# Patient Record
Sex: Male | Born: 1950 | Race: Black or African American | Hispanic: No | Marital: Married | Smoking: Never smoker
Health system: Southern US, Community
[De-identification: ages and names within clinical notes are randomized; demographics above are authoritative.]

## PROBLEM LIST (undated history)

## (undated) DIAGNOSIS — J984 Other disorders of lung: Secondary | ICD-10-CM

## (undated) DIAGNOSIS — J45909 Unspecified asthma, uncomplicated: Secondary | ICD-10-CM

## (undated) DIAGNOSIS — J309 Allergic rhinitis, unspecified: Secondary | ICD-10-CM

## (undated) DIAGNOSIS — M5030 Other cervical disc degeneration, unspecified cervical region: Secondary | ICD-10-CM

## (undated) DIAGNOSIS — M1990 Unspecified osteoarthritis, unspecified site: Secondary | ICD-10-CM

## (undated) DIAGNOSIS — J189 Pneumonia, unspecified organism: Secondary | ICD-10-CM

## (undated) DIAGNOSIS — B353 Tinea pedis: Secondary | ICD-10-CM

## (undated) DIAGNOSIS — H547 Unspecified visual loss: Secondary | ICD-10-CM

## (undated) DIAGNOSIS — K219 Gastro-esophageal reflux disease without esophagitis: Secondary | ICD-10-CM

## (undated) DIAGNOSIS — I10 Essential (primary) hypertension: Secondary | ICD-10-CM

## (undated) HISTORY — DX: Essential (primary) hypertension: I10

## (undated) HISTORY — DX: Other disorders of lung: J98.4

## (undated) HISTORY — DX: Tinea pedis: B35.3

## (undated) HISTORY — DX: Other cervical disc degeneration, unspecified cervical region: M50.30

## (undated) HISTORY — DX: Unspecified osteoarthritis, unspecified site: M19.90

## (undated) HISTORY — DX: Allergic rhinitis, unspecified: J30.9

## (undated) HISTORY — DX: Unspecified visual loss: H54.7

## (undated) HISTORY — DX: Unspecified asthma, uncomplicated: J45.909

## (undated) HISTORY — DX: Gastro-esophageal reflux disease without esophagitis: K21.9

## (undated) HISTORY — DX: Pneumonia, unspecified organism: J18.9

---

## 2005-01-18 ENCOUNTER — Emergency Department (HOSPITAL_COMMUNITY): Admission: EM | Admit: 2005-01-18 | Discharge: 2005-01-18 | Payer: Self-pay | Admitting: Family Medicine

## 2006-12-21 ENCOUNTER — Emergency Department (HOSPITAL_COMMUNITY): Admission: EM | Admit: 2006-12-21 | Discharge: 2006-12-21 | Payer: Self-pay | Admitting: Family Medicine

## 2009-05-20 ENCOUNTER — Ambulatory Visit (HOSPITAL_COMMUNITY): Admission: RE | Admit: 2009-05-20 | Discharge: 2009-05-20 | Payer: Self-pay | Admitting: Internal Medicine

## 2009-12-02 ENCOUNTER — Emergency Department (HOSPITAL_COMMUNITY): Admission: EM | Admit: 2009-12-02 | Discharge: 2009-12-02 | Payer: Self-pay | Admitting: Family Medicine

## 2011-04-15 LAB — POCT URINALYSIS DIP (DEVICE)
Ketones, ur: NEGATIVE
Protein, ur: 100 — AB
Specific Gravity, Urine: 1.025
pH: 6

## 2011-04-15 LAB — URINE CULTURE: Colony Count: 75000

## 2012-03-17 HISTORY — PX: TRANSTHORACIC ECHOCARDIOGRAM: SHX275

## 2012-03-17 HISTORY — PX: OTHER SURGICAL HISTORY: SHX169

## 2012-03-17 HISTORY — PX: NM MYOCAR MULTIPLE W/SPECT: HXRAD626

## 2012-03-31 ENCOUNTER — Other Ambulatory Visit: Payer: Self-pay | Admitting: Internal Medicine

## 2012-03-31 ENCOUNTER — Ambulatory Visit
Admission: RE | Admit: 2012-03-31 | Discharge: 2012-03-31 | Disposition: A | Discharge status: Home / Self Care | Payer: Managed Care, Other (non HMO) | Source: Ambulatory Visit | Attending: Internal Medicine | Admitting: Internal Medicine

## 2012-03-31 DIAGNOSIS — M1990 Unspecified osteoarthritis, unspecified site: Secondary | ICD-10-CM

## 2012-03-31 DIAGNOSIS — R05 Cough: Secondary | ICD-10-CM

## 2012-03-31 DIAGNOSIS — R0989 Other specified symptoms and signs involving the circulatory and respiratory systems: Secondary | ICD-10-CM

## 2012-03-31 DIAGNOSIS — R059 Cough, unspecified: Secondary | ICD-10-CM

## 2012-11-07 ENCOUNTER — Encounter: Payer: Self-pay | Admitting: Hematology

## 2013-03-22 ENCOUNTER — Encounter: Payer: Self-pay | Admitting: Cardiovascular Disease

## 2013-10-31 IMAGING — CR DG HIP (WITH OR WITHOUT PELVIS) 2-3V*L*
2 series · 2 of 2 positions shown · non-contrast
Comparison: None.

CLINICAL DATA: Evaluate for degenerative joint disease

LEFT HIP - COMPLETE 2+ VIEW

[t hip ap left]
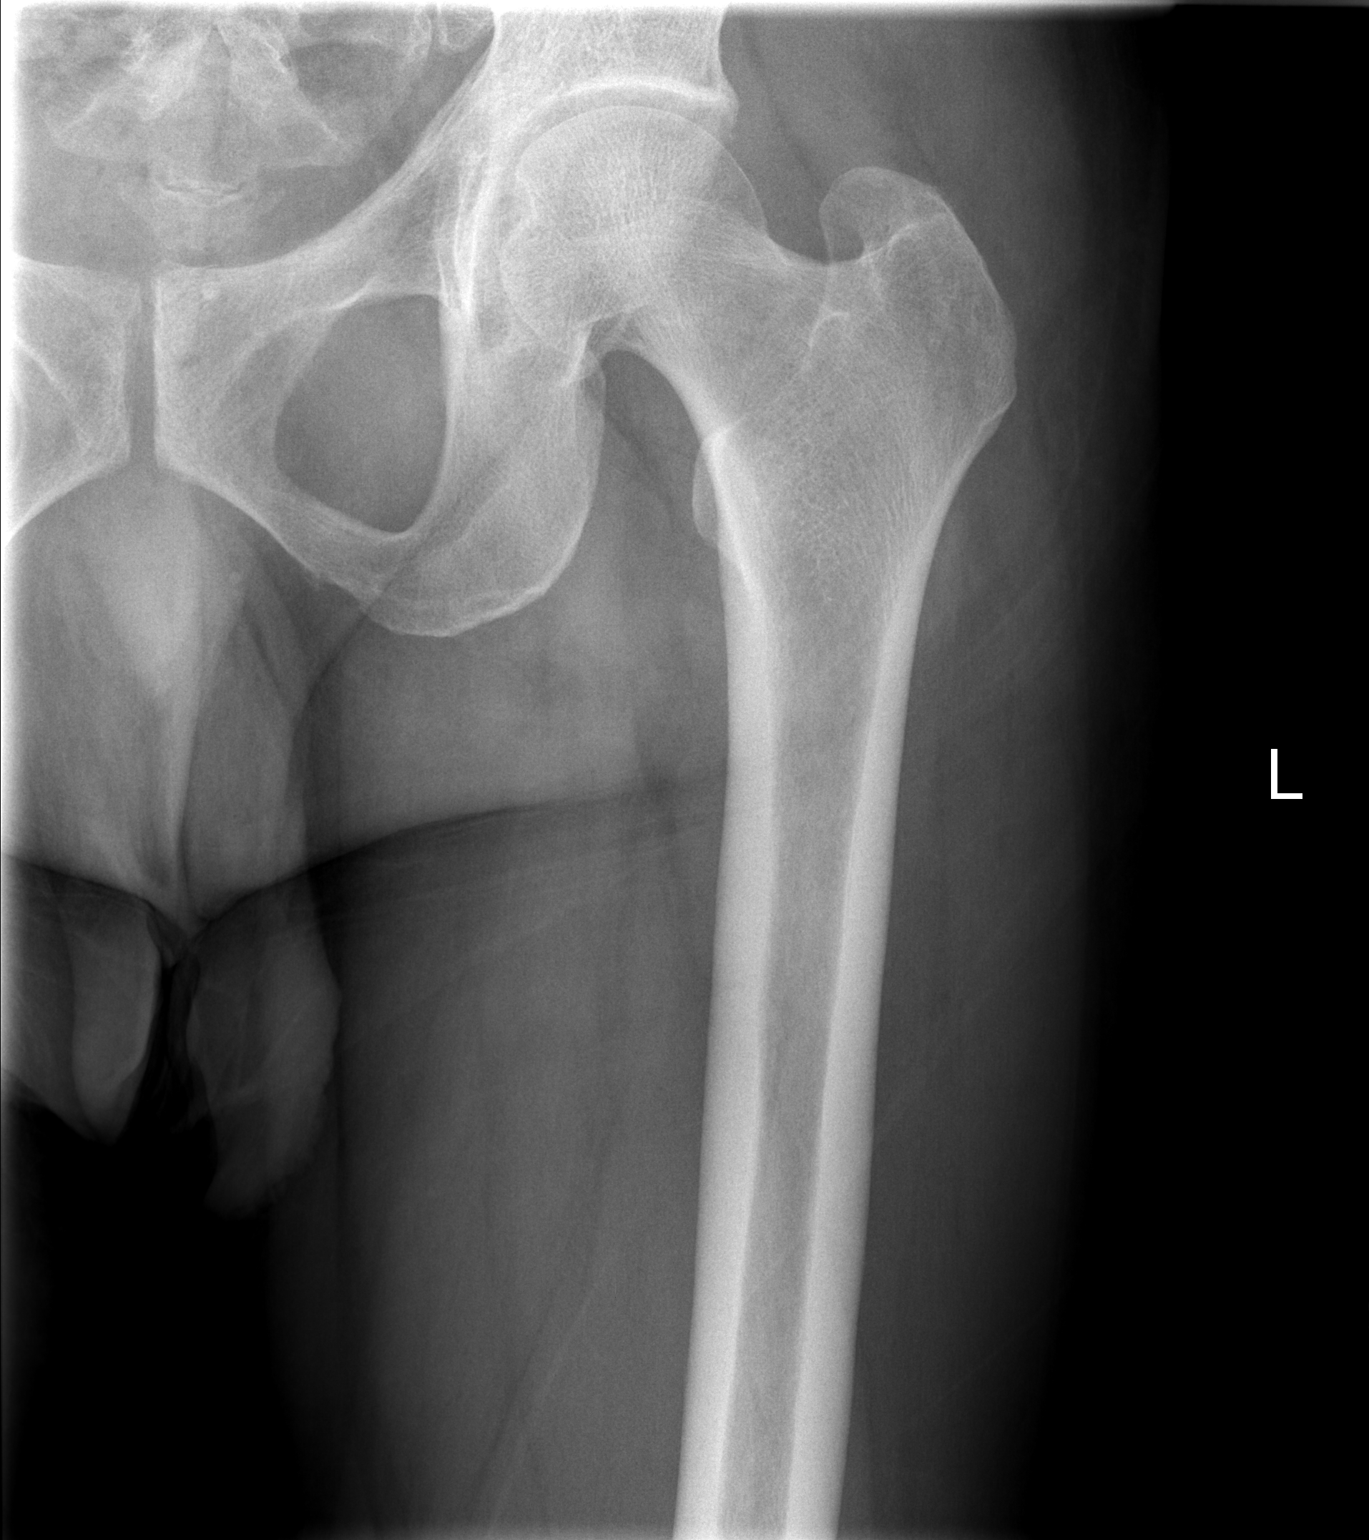

[t hip frog leg left]
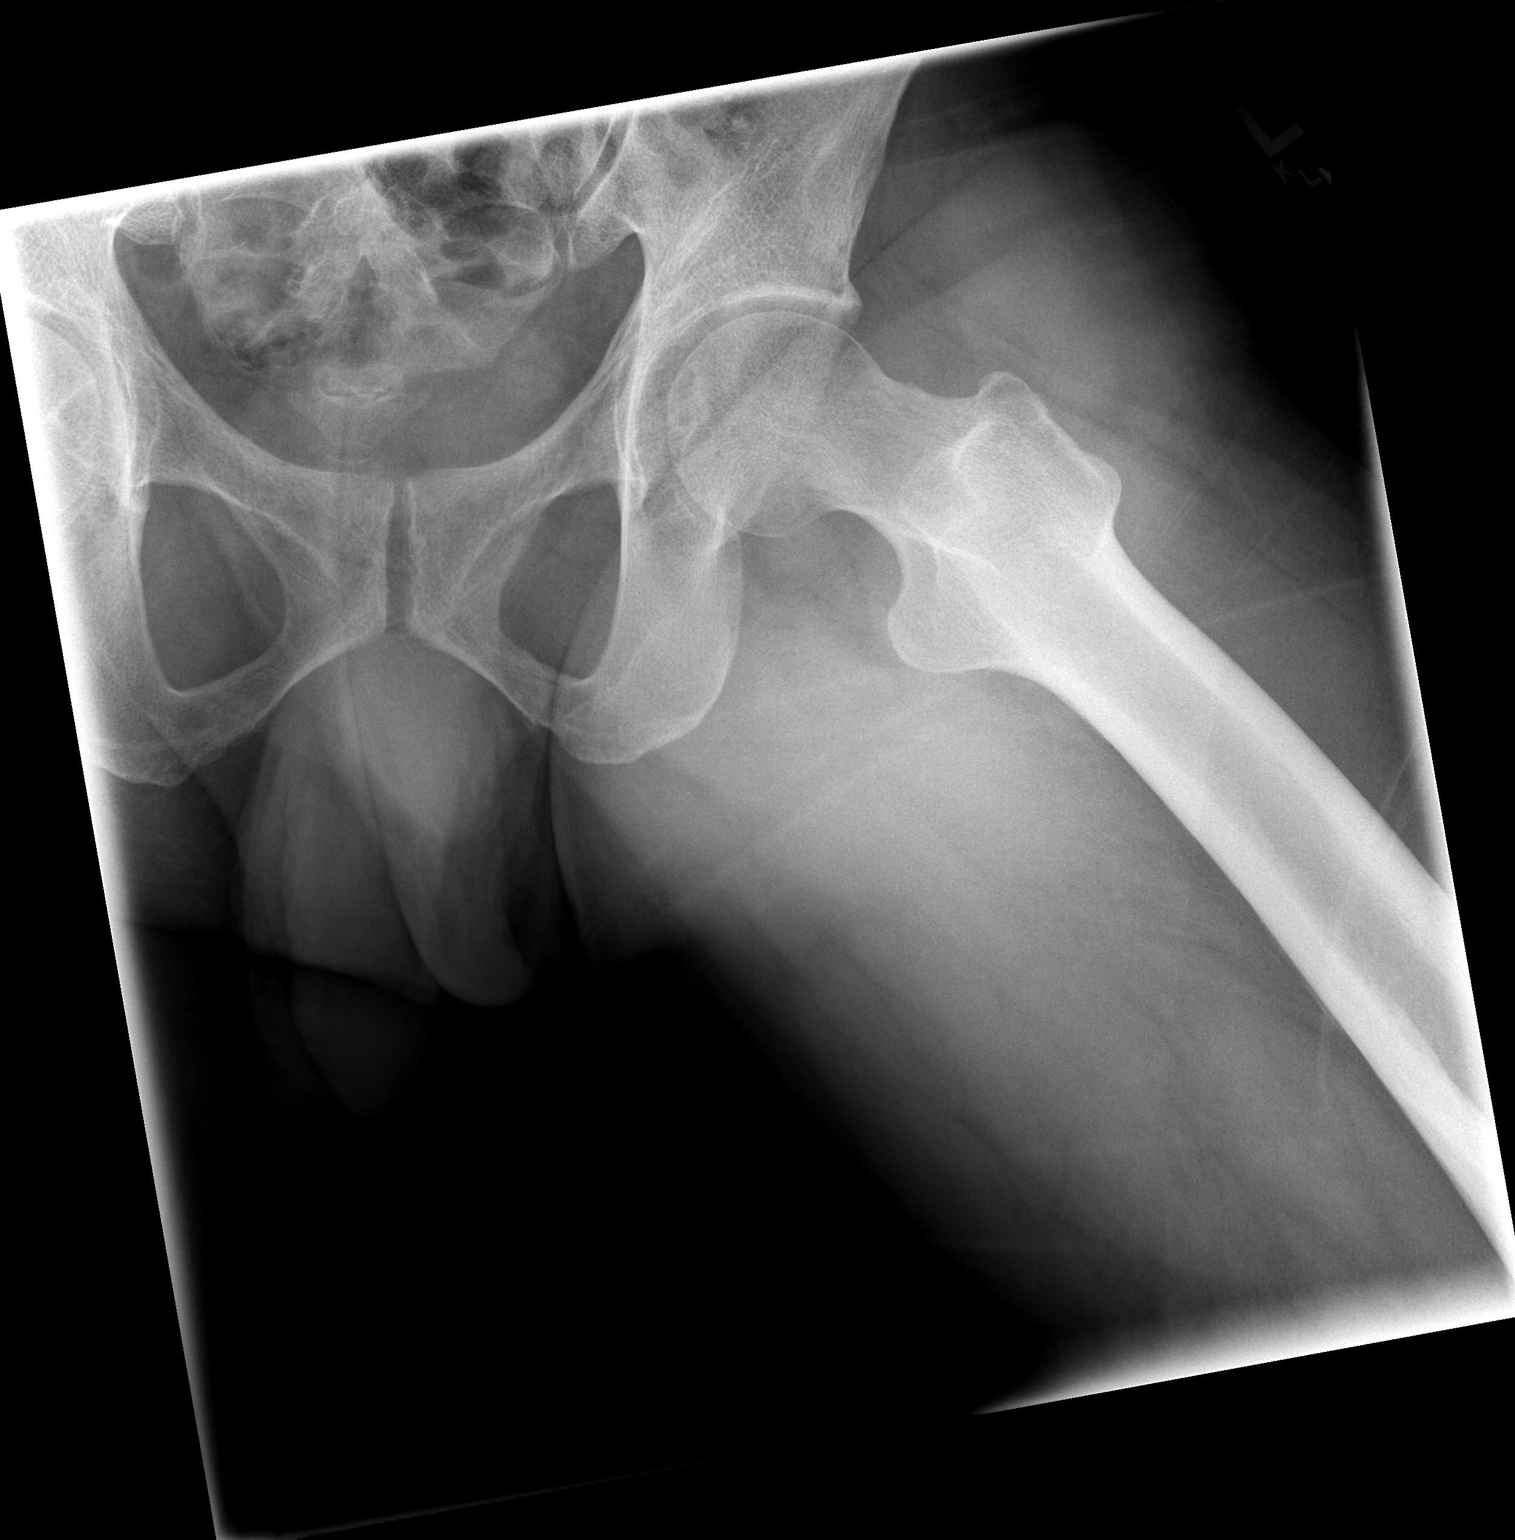

[2 of 2 positions shown; findings below may reference images not displayed]

FINDINGS: Two views of the left hip show minimal degenerative joint
disease.  No significant spurring or loss of joint space is seen.
No acute abnormality is noted.
IMPRESSION: Very minimal degenerative joint disease for age.

## 2013-10-31 IMAGING — CR DG CHEST 2V
2 series · 2 of 2 positions shown · non-contrast
Comparison: None.

CLINICAL DATA: Cough, abnormal lung sounds

CHEST - 2 VIEW

[w chest pa]
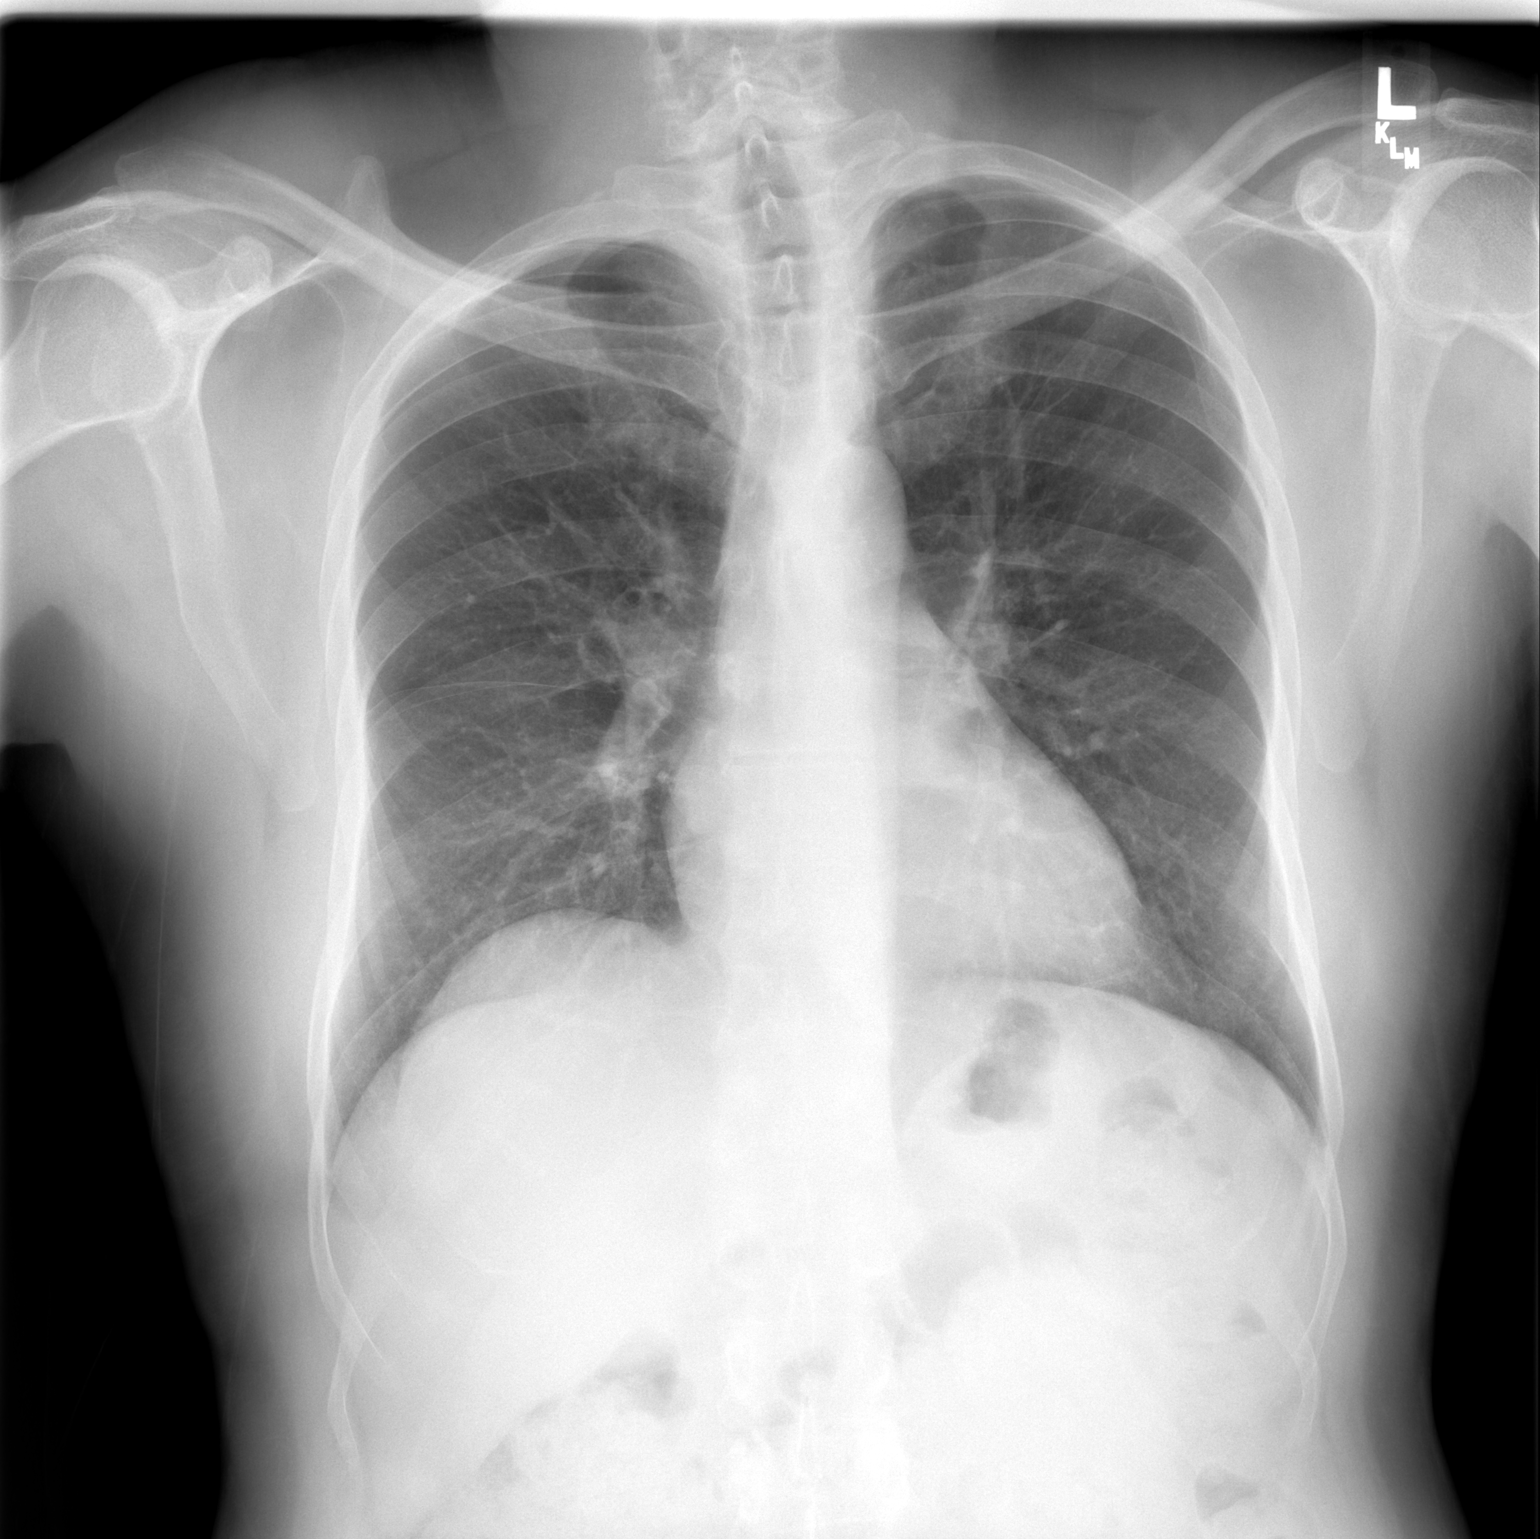

[w chest lat]
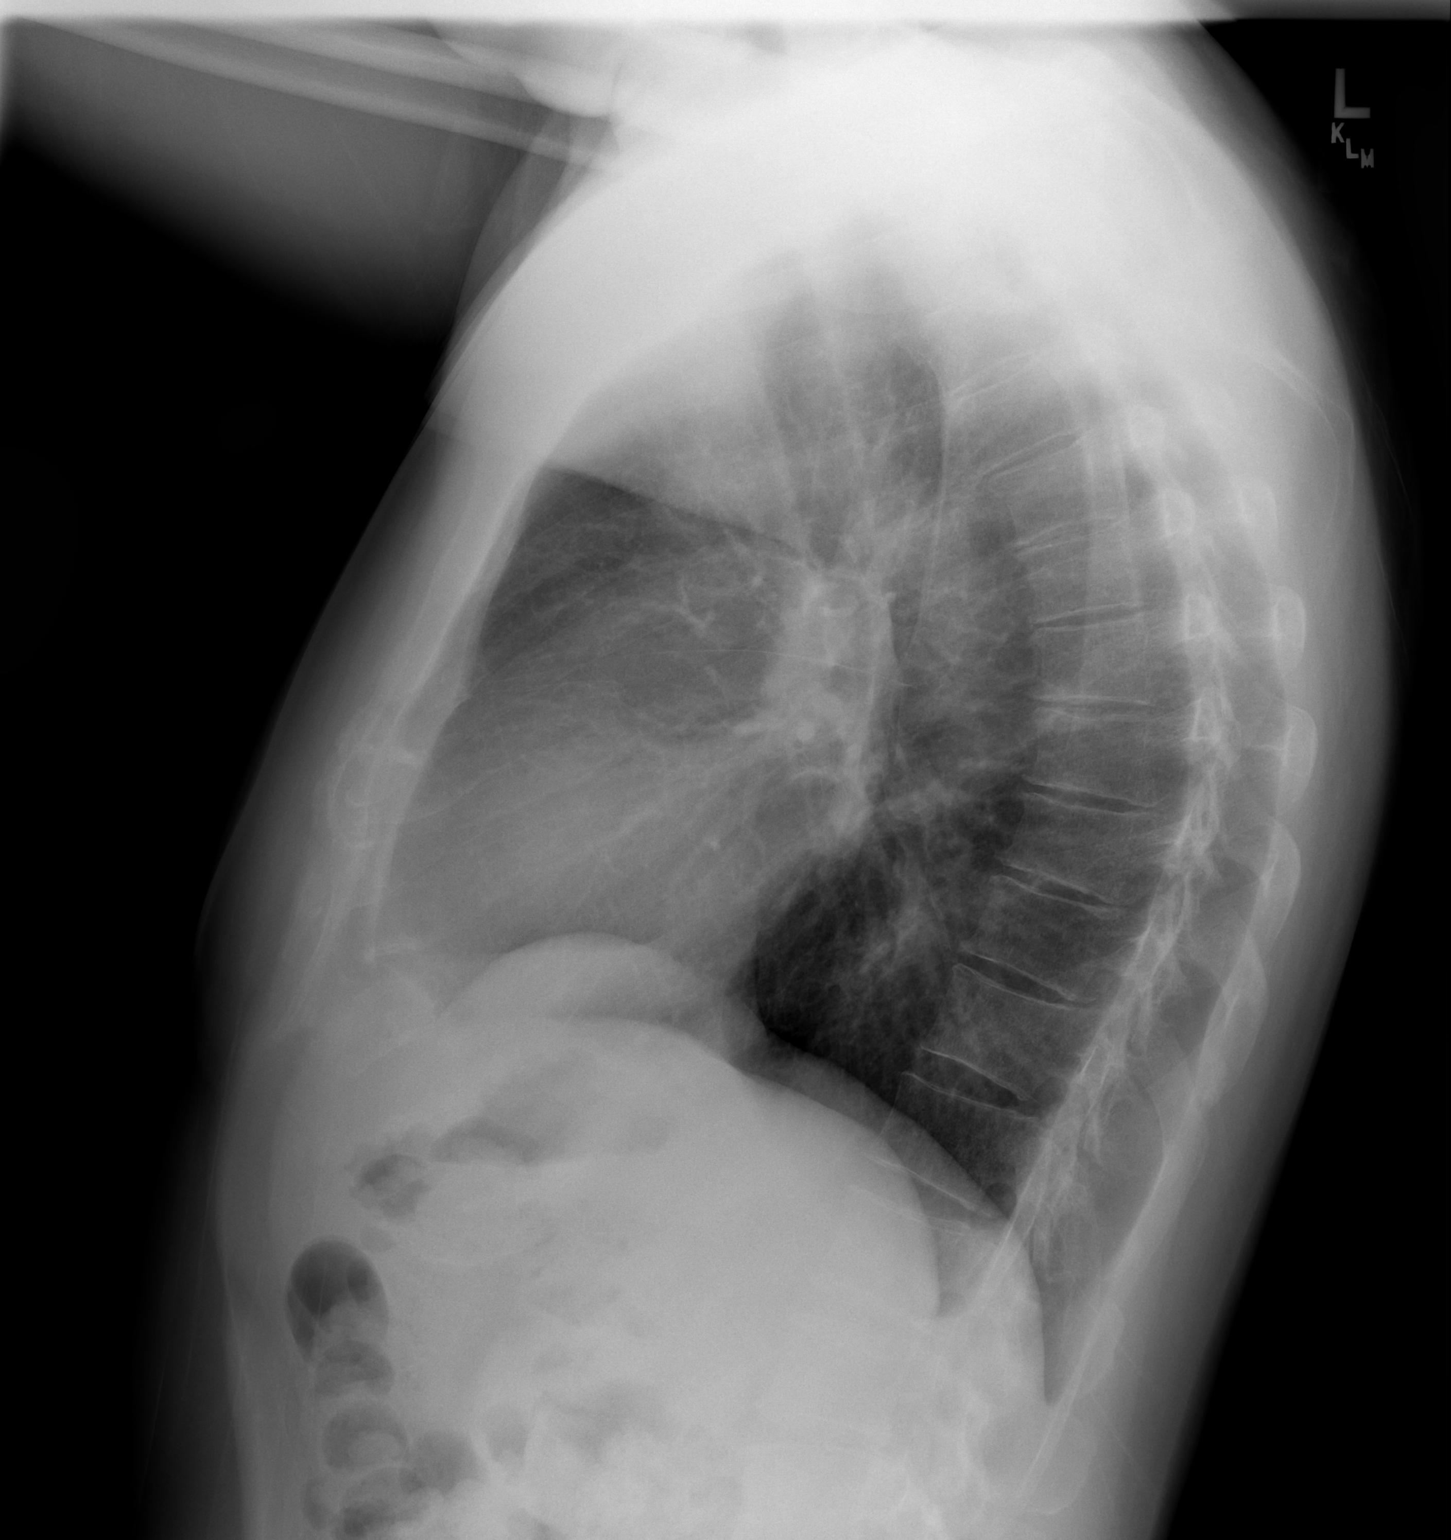

[2 of 2 positions shown; findings below may reference images not displayed]

FINDINGS: No active infiltrate or effusion is seen.  Mediastinal
contours appear normal.  The heart is within normal limits in size
for age.  No bony abnormality is seen.
IMPRESSION: No active lung disease.

## 2014-01-07 ENCOUNTER — Emergency Department (HOSPITAL_COMMUNITY)
Admission: EM | Admit: 2014-01-07 | Discharge: 2014-01-07 | Disposition: A | Discharge status: Home / Self Care | Payer: Managed Care, Other (non HMO) | Attending: Emergency Medicine | Admitting: Emergency Medicine

## 2014-01-07 ENCOUNTER — Encounter (HOSPITAL_COMMUNITY): Payer: Self-pay | Admitting: Emergency Medicine

## 2014-01-07 ENCOUNTER — Emergency Department (INDEPENDENT_AMBULATORY_CARE_PROVIDER_SITE_OTHER)
Admission: EM | Admit: 2014-01-07 | Discharge: 2014-01-07 | Disposition: A | Discharge status: Nursing Home (Includes ICF) | Payer: Managed Care, Other (non HMO) | Source: Home / Self Care | Attending: Family Medicine | Admitting: Family Medicine

## 2014-01-07 DIAGNOSIS — I498 Other specified cardiac arrhythmias: Secondary | ICD-10-CM | POA: Insufficient documentation

## 2014-01-07 DIAGNOSIS — Z8739 Personal history of other diseases of the musculoskeletal system and connective tissue: Secondary | ICD-10-CM | POA: Insufficient documentation

## 2014-01-07 DIAGNOSIS — E860 Dehydration: Secondary | ICD-10-CM

## 2014-01-07 DIAGNOSIS — Z8701 Personal history of pneumonia (recurrent): Secondary | ICD-10-CM | POA: Insufficient documentation

## 2014-01-07 DIAGNOSIS — R110 Nausea: Secondary | ICD-10-CM | POA: Insufficient documentation

## 2014-01-07 DIAGNOSIS — J45909 Unspecified asthma, uncomplicated: Secondary | ICD-10-CM | POA: Insufficient documentation

## 2014-01-07 DIAGNOSIS — Z8619 Personal history of other infectious and parasitic diseases: Secondary | ICD-10-CM | POA: Insufficient documentation

## 2014-01-07 DIAGNOSIS — I10 Essential (primary) hypertension: Secondary | ICD-10-CM | POA: Insufficient documentation

## 2014-01-07 DIAGNOSIS — Z8719 Personal history of other diseases of the digestive system: Secondary | ICD-10-CM | POA: Insufficient documentation

## 2014-01-07 DIAGNOSIS — Z79899 Other long term (current) drug therapy: Secondary | ICD-10-CM | POA: Insufficient documentation

## 2014-01-07 DIAGNOSIS — R5383 Other fatigue: Secondary | ICD-10-CM

## 2014-01-07 DIAGNOSIS — R001 Bradycardia, unspecified: Secondary | ICD-10-CM

## 2014-01-07 DIAGNOSIS — R5381 Other malaise: Secondary | ICD-10-CM | POA: Insufficient documentation

## 2014-01-07 DIAGNOSIS — Z7982 Long term (current) use of aspirin: Secondary | ICD-10-CM | POA: Insufficient documentation

## 2014-01-07 LAB — CBC WITH DIFFERENTIAL/PLATELET
Basophils Absolute: 0 10*3/uL (ref 0.0–0.1)
Basophils Relative: 1 % (ref 0–1)
Eosinophils Absolute: 0 10*3/uL (ref 0.0–0.7)
Eosinophils Relative: 1 % (ref 0–5)
HCT: 42 % (ref 39.0–52.0)
HEMOGLOBIN: 14.8 g/dL (ref 13.0–17.0)
LYMPHS ABS: 1.3 10*3/uL (ref 0.7–4.0)
LYMPHS PCT: 29 % (ref 12–46)
MCH: 31.4 pg (ref 26.0–34.0)
MCHC: 35.2 g/dL (ref 30.0–36.0)
MCV: 89.2 fL (ref 78.0–100.0)
MONO ABS: 0.6 10*3/uL (ref 0.1–1.0)
Monocytes Relative: 14 % — ABNORMAL HIGH (ref 3–12)
Neutro Abs: 2.4 10*3/uL (ref 1.7–7.7)
Neutrophils Relative %: 55 % (ref 43–77)
Platelets: 204 10*3/uL (ref 150–400)
RBC: 4.71 MIL/uL (ref 4.22–5.81)
RDW: 12.4 % (ref 11.5–15.5)
WBC: 4.4 10*3/uL (ref 4.0–10.5)

## 2014-01-07 LAB — COMPREHENSIVE METABOLIC PANEL
ALT: 21 U/L (ref 0–53)
AST: 23 U/L (ref 0–37)
Albumin: 3.6 g/dL (ref 3.5–5.2)
Alkaline Phosphatase: 69 U/L (ref 39–117)
Anion gap: 12 (ref 5–15)
BUN: 13 mg/dL (ref 6–23)
CALCIUM: 9.5 mg/dL (ref 8.4–10.5)
CO2: 29 meq/L (ref 19–32)
CREATININE: 1.13 mg/dL (ref 0.50–1.35)
Chloride: 98 mEq/L (ref 96–112)
GFR, EST AFRICAN AMERICAN: 78 mL/min — AB (ref >90)
GFR, EST NON AFRICAN AMERICAN: 67 mL/min — AB (ref >90)
GLUCOSE: 96 mg/dL (ref 70–99)
Potassium: 3.9 mEq/L (ref 3.7–5.3)
Sodium: 139 mEq/L (ref 137–147)
Total Bilirubin: 0.9 mg/dL (ref 0.3–1.2)
Total Protein: 7 g/dL (ref 6.0–8.3)

## 2014-01-07 LAB — URINALYSIS, ROUTINE W REFLEX MICROSCOPIC
Bilirubin Urine: NEGATIVE
Glucose, UA: NEGATIVE mg/dL
Hgb urine dipstick: NEGATIVE
KETONES UR: NEGATIVE mg/dL
LEUKOCYTES UA: NEGATIVE
Nitrite: NEGATIVE
PROTEIN: NEGATIVE mg/dL
Specific Gravity, Urine: 1.004 — ABNORMAL LOW (ref 1.005–1.030)
UROBILINOGEN UA: 0.2 mg/dL (ref 0.0–1.0)
pH: 6.5 (ref 5.0–8.0)

## 2014-01-07 LAB — POCT I-STAT, CHEM 8
BUN: 13 mg/dL (ref 6–23)
CALCIUM ION: 1.15 mmol/L (ref 1.13–1.30)
CHLORIDE: 96 meq/L (ref 96–112)
CREATININE: 1.2 mg/dL (ref 0.50–1.35)
GLUCOSE: 94 mg/dL (ref 70–99)
HCT: 42 % (ref 39.0–52.0)
Hemoglobin: 14.3 g/dL (ref 13.0–17.0)
Potassium: 3.6 mEq/L — ABNORMAL LOW (ref 3.7–5.3)
Sodium: 137 mEq/L (ref 137–147)
TCO2: 30 mmol/L (ref 0–100)

## 2014-01-07 LAB — CBG MONITORING, ED: GLUCOSE-CAPILLARY: 96 mg/dL (ref 70–99)

## 2014-01-07 MED ORDER — SODIUM CHLORIDE 0.9 % IV SOLN: 1000.0000 mL | INTRAVENOUS | Status: DC

## 2014-01-07 MED ORDER — SODIUM CHLORIDE 0.9 % IV SOLN: Freq: Once | INTRAVENOUS | Status: DC

## 2014-01-07 MED ORDER — SODIUM CHLORIDE 0.9 % IV SOLN
1000.0000 mL | Freq: Once | INTRAVENOUS | Status: DC
Start: 2014-01-07 — End: 2014-01-08

## 2014-01-07 MED ORDER — SODIUM CHLORIDE 0.9 % IV BOLUS (SEPSIS)
1000.0000 mL | Freq: Once | INTRAVENOUS | Status: AC
  Administered 2014-01-07: 1000 mL via INTRAVENOUS

## 2014-01-07 MED ORDER — SODIUM CHLORIDE 0.9 % IV SOLN
1000.0000 mL | Freq: Once | INTRAVENOUS | Status: AC
  Administered 2014-01-07: 1000 mL via INTRAVENOUS

## 2014-01-07 NOTE — ED Notes (Signed)
C/O hypotension.  Was feeling sluggish today, wife checked BP = 90's/60's.  Denies any pain, SOB.  Feels slight nausea.  States DID take HTN med today.  Bradycardia noted - denies hx.  Reports being in middle of 21-day fast (witholding food throughout the day) for religious purposes.

## 2014-01-07 NOTE — ED Notes (Signed)
[facility] em[facility] Pt is on month long fasting for religious beliefs. Pt was working in yard for 5 hours yesterday. Pt sent here for dehydration. Pt was bradycardic in 40's and hypotensive at home. Pt c/o dizziness and nausea. Inverted t waved noted in avf leads. Pt sts balance was off yesterday.

## 2014-01-07 NOTE — ED Notes (Signed)
[facility] truck unavailable for several minutes.  [facility] en route.  Report called to [place], ED Consulting civil engineerCharge RN.

## 2014-01-07 NOTE — ED Provider Notes (Signed)
CSN: [id number]     Arrival date & time 01/07/14  1920 History   First MD Initiated Contact with Patient 01/07/14 1926     Chief Complaint  Patient presents with  . Dehydration     (Consider location/radiation/quality/duration/timing/severity/associated sxs/prior Treatment) HPI [name] with hx of HTN, asthma, PNA, presents with weakness and nausea, [facility] Concern for dehydration. Patient also with low HR. Hypotensive at home per blood pressure check at home. Patient denies any chest pain, abdominal pain, numbness, tingling.   Past Medical History  Diagnosis Date  . GERD (gastroesophageal reflux disease)   . PNA (pneumonia)   . Hypertension   . Asthma   . Allergic rhinitis   . DJD (degenerative joint disease)   . Visual impairment   . Tinea pedis   . DDD (degenerative disc disease), cervical   . Restrictive lung disease    History reviewed. No pertinent past surgical history. Family History  Problem Relation Age of Onset  . Hypertension Mother    History  Substance Use Topics  . Smoking status: Never Smoker   . Smokeless tobacco: Not on file  . Alcohol Use: No    Review of Systems  Constitutional: Negative for fever and chills.  HENT: Negative for sore throat.   Eyes: Negative for pain.  Respiratory: Negative for cough and shortness of breath.   Cardiovascular: Negative for chest pain.  Gastrointestinal: Positive for nausea. Negative for vomiting and abdominal pain.  Genitourinary: Negative for dysuria and flank pain.  Musculoskeletal: Negative for back pain and neck pain.  Skin: Negative for rash.  Neurological: Positive for weakness. Negative for seizures and headaches.      Allergies  Pork-derived products  Home Medications   Prior to Admission medications   Medication Sig Start Date End Date Taking? Authorizing Provider  ALOE VERA JUICE LIQD Take 60 mLs by mouth 2 (two) times a week.   Yes Historical Provider, MD  aspirin EC 81 MG tablet Take 81  mg by mouth 2 (two) times a week. On Sundays and Mondays   Yes Historical Provider, MD  atorvastatin (LIPITOR) 10 MG tablet Take 10 mg by mouth daily.   Yes Historical Provider, MD  BEE POLLEN PO Take 2 tablets by mouth 2 (two) times daily.   Yes Historical Provider, MD  Cholecalciferol (VITAMIN D PO) Take 1 tablet by mouth daily.   Yes Historical Provider, MD  losartan (COZAAR) 50 MG tablet Take 50 mg by mouth daily.   Yes Historical Provider, MD  Misc Natural Products (BEE PROPOLIS PO) Take 1 tablet by mouth once a week.   Yes Historical Provider, MD  ROYAL JELLY PO Take 1 tablet by mouth daily as needed (energy).   Yes Historical Provider, MD  triamterene-hydrochlorothiazide (MAXZIDE-25) 37.5-25 MG per tablet Take 1 tablet by mouth daily.   Yes Historical Provider, MD   BP 143/69  Pulse 43  Temp(Src) 98.1 F (36.7 C) (Oral)  Resp 9  Ht 5\' 9"  (1.753 m)  Wt 172 lb (78.019 kg)  BMI 25.39 kg/m2  SpO2 100% Physical Exam  Constitutional: He is oriented to person, place, and time. He appears well-developed and well-nourished. No distress.  HENT:  Head: Normocephalic and atraumatic.  Eyes: Pupils are equal, round, and reactive to light.  Neck: Normal range of motion.  Cardiovascular: Regular rhythm.  Bradycardia present.   Pulmonary/Chest: Effort normal and breath sounds normal.  Abdominal: Soft. He exhibits no distension. There is no tenderness.  Musculoskeletal:  Normal range of motion.  Neurological: He is alert and oriented to person, place, and time.  Skin: Skin is warm. He is not diaphoretic.    ED Course  Procedures (including critical care time) Labs Review Labs Reviewed  CBC WITH DIFFERENTIAL - Abnormal; Notable for the following:    Monocytes Relative 14 (*)    All other components within normal limits  COMPREHENSIVE METABOLIC PANEL - Abnormal; Notable for the following:    GFR calc non Af Amer 67 (*)    GFR calc Af Amer 78 (*)    All other components within normal  limits  URINALYSIS, ROUTINE W REFLEX MICROSCOPIC - Abnormal; Notable for the following:    Specific Gravity, Urine 1.004 (*)    All other components within normal limits  CBG MONITORING, ED    Imaging Review No results found.   EKG Interpretation   Date/Time:  Sunday January 07 2014 19:21:52 EDT Ventricular Rate:  52 PR Interval:  151 QRS Duration: 93 QT Interval:  443 QTC Calculation: 412 R Axis:   40 Text Interpretation:  Sinus rhythm Probable left atrial enlargement No  previous ECGs available Confirmed by YAO  MD, DAVID ([id number]) on 01/07/2014  7:40:47 PM      MDM   Final diagnoses:  Dehydration   [name] with no significant past medical history presents with mild weakness for approximately 24 hours.  The patient appears dry and exam. He hemodynamically stable here. There was concern for possible hypotension but this reading was taken at home and has not been elevated at the urgent care Center or here. The patient has normal blood pressures here. Patient's heart rate is in the 40s and 50s throughout his emergency room stay. I doubt that this is symptomatic bradycardia as the patient has a history of bradycardia. The patient is also on Toprol-XL. Given the patient's presentation, patient is likely dehydrated and the plan is to obtain some basic labs and to rehydrate the patient.  Patient feels improved after 2 L of normal saline boluses. Patient without hypotension. She was stable heart rate. We'll discharge the patient home in stable condition. Strict return precautions given. Patient seen and evaluated by myself and by the attending Dr. Silverio Lay.      Imagene Sheller, MD 01/08/14 [phone]

## 2014-01-07 NOTE — Discharge Instructions (Signed)
Dehydration, Adult Dehydration is when you lose more fluids from the body than you take in. Vital organs like the kidneys, brain, and heart cannot function without a proper amount of fluids and salt. Any loss of fluids from the body can cause dehydration.  CAUSES   Vomiting.  Diarrhea.  Excessive sweating.  Excessive urine output.  Fever. SYMPTOMS  Mild dehydration  Thirst.  Dry lips.  Slightly dry mouth. Moderate dehydration  Very dry mouth.  Sunken eyes.  Skin does not bounce back quickly when lightly pinched and released.  Dark urine and decreased urine production.  Decreased tear production.  Headache. Severe dehydration  Very dry mouth.  Extreme thirst.  Rapid, weak pulse (more than 100 beats per minute at rest).  Cold hands and feet.  Not able to sweat in spite of heat and temperature.  Rapid breathing.  Blue lips.  Confusion and lethargy.  Difficulty being awakened.  Minimal urine production.  No tears. DIAGNOSIS  Your caregiver will diagnose dehydration based on your symptoms and your exam. Blood and urine tests will help confirm the diagnosis. The diagnostic evaluation should also identify the cause of dehydration. TREATMENT  Treatment of mild or moderate dehydration can often be done at home by increasing the amount of fluids that you drink. It is best to drink small amounts of fluid more often. Drinking too much at one time can make vomiting worse. Refer to the home care instructions below. Severe dehydration needs to be treated at the hospital where you will probably be given intravenous (IV) fluids that contain water and electrolytes. HOME CARE INSTRUCTIONS   Ask your caregiver about specific rehydration instructions.  Drink enough fluids to keep your urine clear or pale yellow.  Drink small amounts frequently if you have nausea and vomiting.  Eat as you normally do.  Avoid:  Foods or drinks high in sugar.  Carbonated  drinks.  Juice.  Extremely hot or cold fluids.  Drinks with caffeine.  Fatty, greasy foods.  Alcohol.  Tobacco.  Overeating.  Gelatin desserts.  Wash your hands well to avoid spreading bacteria and viruses.  Only take over-the-counter or prescription medicines for pain, discomfort, or fever as directed by your caregiver.  Ask your caregiver if you should continue all prescribed and over-the-counter medicines.  Keep all follow-up appointments with your caregiver. SEEK MEDICAL CARE IF:  You have abdominal pain and it increases or stays in one area (localizes).  You have a rash, stiff neck, or severe headache.  You are irritable, sleepy, or difficult to awaken.  You are weak, dizzy, or extremely thirsty. SEEK IMMEDIATE MEDICAL CARE IF:   You are unable to keep fluids down or you get worse despite treatment.  You have frequent episodes of vomiting or diarrhea.  You have blood or green matter (bile) in your vomit.  You have blood in your stool or your stool looks black and tarry.  You have not urinated in 6 to 8 hours, or you have only urinated a small amount of very dark urine.  You have a fever.  You faint. MAKE SURE YOU:   Understand these instructions.  Will watch your condition.  Will get help right away if you are not doing well or get worse. Document Released: 06/15/2005 Document Revised: 09/07/2011 Document Reviewed: 02/02/2011 ExitCare Patient Information 2015 [employer]. This information is not intended to replace advice given to you by your health care provider. Make sure you discuss any questions you have with your health care   provider.  

## 2014-01-07 NOTE — ED Notes (Signed)
Report given to [facility]. 

## 2014-01-07 NOTE — ED Provider Notes (Signed)
CSN: [id number]     Arrival date & time 01/07/14  1719 History   First MD Initiated Contact with Patient 01/07/14 1741     Chief Complaint  Patient presents with  . Hypotension   (Consider location/radiation/quality/duration/timing/severity/associated sxs/prior Treatment) Patient is a 63 y.o. male presenting with weakness. The history is provided by the patient and the spouse.  Weakness This is a new problem. The current episode started yesterday (in midst of 21d fast for religion, mowed grass yest, felt weak last eve and today with sl nausea. found to have hypotension and bradycardia.). The problem has been gradually worsening. Pertinent negatives include no chest pain, no abdominal pain, no headaches and no shortness of breath.    Past Medical History  Diagnosis Date  . GERD (gastroesophageal reflux disease)   . PNA (pneumonia)   . Hypertension   . Asthma   . Allergic rhinitis   . DJD (degenerative joint disease)   . Visual impairment   . Tinea pedis   . DDD (degenerative disc disease), cervical   . Restrictive lung disease    History reviewed. No pertinent past surgical history. Family History  Problem Relation Age of Onset  . Hypertension Mother    History  Substance Use Topics  . Smoking status: Never Smoker   . Smokeless tobacco: Not on file  . Alcohol Use: No    Review of Systems  Respiratory: Negative for shortness of breath.   Cardiovascular: Negative for chest pain, palpitations and leg swelling.  Gastrointestinal: Positive for nausea. Negative for vomiting, abdominal pain and diarrhea.  Genitourinary: Negative.   Neurological: Positive for weakness. Negative for headaches.    Allergies  Review of patient's allergies indicates no active allergies.  Home Medications   Prior to Admission medications   Medication Sig Start Date End Date Taking? Authorizing Provider  aspirin 81 MG tablet Take 81 mg by mouth 2 (two) times a week.    Yes Historical Provider,  MD  Atorvastatin Calcium (LIPITOR PO) Take by mouth.   Yes Historical Provider, MD  b complex vitamins tablet Take 1 tablet by mouth daily.   Yes Historical Provider, MD  Losartan Potassium (COZAAR PO) Take by mouth.   Yes Historical Provider, MD  Multiple Vitamins-Minerals (MULTIVITAMIN PO) Take by mouth daily.   Yes Historical Provider, MD  TRIAMTERENE-HCTZ PO Take by mouth daily.   Yes Historical Provider, MD  FLAXSEED, LINSEED, PO Take by mouth daily.    Historical Provider, MD  metoprolol succinate (TOPROL-XL) 25 MG 24 hr tablet Take 25 mg by mouth daily.    Historical Provider, MD  UNABLE TO FIND "Apotex"    Historical Provider, MD   There were no vitals taken for this visit. Physical Exam  Nursing note and vitals reviewed. Constitutional: He is oriented to person, place, and time. He appears well-developed and well-nourished. No distress.  HENT:  Mouth/Throat: Oropharynx is clear and moist.  Eyes: Pupils are equal, round, and reactive to light.  Neck: Normal range of motion. Neck supple.  Cardiovascular: Normal heart sounds, intact distal pulses and normal pulses.  Bradycardia present.   Pulmonary/Chest: Effort normal and breath sounds normal. He exhibits no tenderness.  Abdominal: Soft. Bowel sounds are normal. There is no tenderness.  Lymphadenopathy:    He has no cervical adenopathy.  Neurological: He is alert and oriented to person, place, and time.  Skin: Skin is warm and dry.    ED Course  Procedures (including critical care time) Labs Review Labs Reviewed  POCT I-STAT, CHEM 8 - Abnormal; Notable for the following:    Potassium 3.6 (*)    All other components within normal limits   i-stat wnl. Imaging Review No results found.   MDM   1. Dehydration syndrome   2. Bradycardia by electrocardiogram    Sent for ivf and monitoring for dehydration from religious fast and outdoor activity. May not be able to respond with tach from toprol induced b-blockade.      Linna HoffJames D Kindl, MD 01/07/14 [phone]

## 2014-01-09 NOTE — ED Provider Notes (Signed)
I saw and evaluated the patient, reviewed the resident's note and I agree with the findings and plan.   EKG Interpretation   Date/Time:  Sunday January 07 2014 19:21:52 EDT Ventricular Rate:  52 PR Interval:  151 QRS Duration: 93 QT Interval:  443 QTC Calculation: 412 R Axis:   40 Text Interpretation:  Sinus rhythm Probable left atrial enlargement No  previous ECGs available Confirmed by Aerion Bagdasarian  MD, Bernis Stecher ([id number]) on 01/07/2014  7:40:47 PM      Jordan Wilcox is a 63 y.o. male otherwise healthy here with dehydration. He has been fasting as part of Ramadan. Was also working in the yard yesterday. Went to urgent care, sent here for IVF. When I examined him, he already had 1 L NS. MM slightly dry. Abdomen soft, nontender. Labs unremarkable. HR in the 40s, chronic. Patient's BP was slightly low in urgent care but not hypotensive in the ED. Given 2L NS and was d/c home.   Richardean Canalavid H Keyara Ent, MD 01/09/14 2229

## 2014-01-25 ENCOUNTER — Ambulatory Visit (INDEPENDENT_AMBULATORY_CARE_PROVIDER_SITE_OTHER): Payer: Managed Care, Other (non HMO) | Admitting: Cardiology

## 2014-01-25 ENCOUNTER — Encounter: Payer: Self-pay | Admitting: Cardiology

## 2014-01-25 VITALS — HR 57 | Ht 68.25 in | Wt 173.1 lb

## 2014-01-25 DIAGNOSIS — R001 Bradycardia, unspecified: Secondary | ICD-10-CM

## 2014-01-25 DIAGNOSIS — I498 Other specified cardiac arrhythmias: Secondary | ICD-10-CM

## 2014-01-25 NOTE — Patient Instructions (Signed)
Brittainy Simmons, PA-C has made no changes in your current medications or treatment plan.  Please keep your previously scheduled appointment with Dr Royann Shiversroitoru on 02/19/2014 at 9:00 am.

## 2014-01-25 NOTE — Progress Notes (Signed)
Patient ID: Jordan Wilcox, male   DOB: [birth year], 63 y.o.   MRN: [id number]    01/30/2014 Delawrence Fridman   18-Sep-1950  [id number]  Primary Physicia DEAN, ERIC, MD Primary Cardiologist: Dr. Alanda Amass  HPI:  The patient is a 63 year old male, referred to our clinic for evaluation of bradycardia. He is a former patient of Dr. Kandis Cocking, and was last seen in clinic in September 2014 before his retirement Dr. Alanda Amass recommended that he establish routine care with Dr. Royann Shivers on a yearly basis. This appointment is scheduled in September. He has no past cardiac history. Cardiac risk factors are significant for hypertension. He was recently seen in the [facility] Emergency Department 01/07/2014 for dehydration. He had been fasting as part of Ramadan and was working out in his yard in the hot heat and noted slight fatigue and generalized malaise. He denied any other symptoms. He was initially seen at an urgent care facility but he was sent to the ER for IV fluids for hydration. While in the ER he was noted to be bradycardic with heart rates in the upper 40s/ low 50s, however he denied symptoms of dizziness, lightheadedness, syncope/near-syncope. He is not on any AV nodal blocking agents.  Today in clinic, his EKG demonstrates sinus bradycardia with heart rate of 57 beats per minute. He is asymptomatic. He also denies any recent or past anginal symptoms. He is able to walk a flight of stairs without chest pain, dyspnea and has no other limitations. He also denies resting dyspnea, orthopnea, PND, lower extremity edema and palpitations.    Current Outpatient Prescriptions  Medication Sig Dispense Refill  . ALOE VERA JUICE LIQD Take 60 mLs by mouth 2 (two) times a week.      Marland Kitchen aspirin EC 81 MG tablet Take 81 mg by mouth 2 (two) times a week. On Sundays and Mondays      . atorvastatin (LIPITOR) 10 MG tablet Take 10 mg by mouth daily.      Marland Kitchen BEE POLLEN PO Take 2 tablets by mouth 2 (two) times daily.      .  Cholecalciferol (VITAMIN D PO) Take 1 tablet by mouth daily.      Marland Kitchen losartan (COZAAR) 50 MG tablet Take 50 mg by mouth daily.      . Misc Natural Products (BEE PROPOLIS PO) Take 1 tablet by mouth once a week.      Marland Kitchen ROYAL JELLY PO Take 1 tablet by mouth daily as needed (energy).      . triamterene-hydrochlorothiazide (MAXZIDE-25) 37.5-25 MG per tablet Take 1 tablet by mouth daily.       No current facility-administered medications for this visit.    Allergies  Allergen Reactions  . Pork-Derived Products Other (See Comments)    Pt does not eat pork for religious reasons    History   Social History  . Marital Status: Married    Spouse Name: N/A    Number of Children: N/A  . Years of Education: N/A   Occupational History  . Not on file.   Social History Main Topics  . Smoking status: Never Smoker   . Smokeless tobacco: Not on file  . Alcohol Use: No  . Drug Use: No  . Sexual Activity: Not on file   Other Topics Concern  . Not on file   Social History Narrative  . No narrative on file     Review of Systems: General: negative for chills, fever, night sweats or weight changes.  Cardiovascular:  negative for chest pain, dyspnea on exertion, edema, orthopnea, palpitations, paroxysmal nocturnal dyspnea or shortness of breath Dermatological: negative for rash Respiratory: negative for cough or wheezing Urologic: negative for hematuria Abdominal: negative for nausea, vomiting, diarrhea, bright red blood per rectum, melena, or hematemesis Neurologic: negative for visual changes, syncope, or dizziness All other systems reviewed and are otherwise negative except as noted above.    Pulse 57, height 5' 8.25" (1.734 m), weight 173 lb 1.6 oz (78.518 kg).  General appearance: alert, cooperative and no distress Neck: no carotid bruit and no JVD Lungs: clear to auscultation bilaterally Heart: regular rate and rhythm, S1, S2 normal, no murmur, click, rub or gallop Extremities: no  LLEE Pulses: 2+ and symmetric Skin: warm and dry Neurologic: Grossly normal  EKG Sinus Bradycardia 57 bpm  ASSESSMENT AND PLAN:   1. Sinus bradycardia: His heart rates in the upper 50s. He is asymptomatic. He is not on any AV nodal blocking agents. He denies any symptoms of dizziness, syncope/near-syncope in the past. Since he is asymptomatic, no further workup/treatment is needed at this time.   PLAN No treatment indicated at this time for his asymptomatic bradycardia. He is scheduled to establish routine care with Dr. Royann Shiversroitoru, per Dr. Alanda AmassWeintraub in September. We will reassess his heart rate as well as reassess him for symptoms when he follows up with Dr. Royann Shiversroitoru. His been instructed to notify our office sooner if he develops any symptoms.  Aijah Lattner, BRITTAINYPA-C 01/30/2014 5:16 PM

## 2014-01-30 DIAGNOSIS — R001 Bradycardia, unspecified: Secondary | ICD-10-CM | POA: Insufficient documentation

## 2014-02-19 ENCOUNTER — Ambulatory Visit: Payer: Managed Care, Other (non HMO) | Admitting: Cardiovascular Disease

## 2014-03-26 ENCOUNTER — Encounter: Payer: Self-pay | Admitting: Cardiovascular Disease

## 2014-03-26 ENCOUNTER — Ambulatory Visit (INDEPENDENT_AMBULATORY_CARE_PROVIDER_SITE_OTHER): Payer: Managed Care, Other (non HMO) | Admitting: Cardiovascular Disease

## 2014-03-26 VITALS — BP 121/70 | HR 55 | Resp 16 | Ht 68.0 in | Wt 172.8 lb

## 2014-03-26 DIAGNOSIS — R001 Bradycardia, unspecified: Secondary | ICD-10-CM

## 2014-03-26 DIAGNOSIS — I498 Other specified cardiac arrhythmias: Secondary | ICD-10-CM

## 2014-03-26 NOTE — Patient Instructions (Signed)
Dr. Croitoru recommends that you schedule a follow-up appointment in: ONE YEAR   

## 2014-03-30 ENCOUNTER — Encounter: Payer: Self-pay | Admitting: Cardiovascular Disease

## 2014-03-30 NOTE — Progress Notes (Signed)
Patient ID: Jordan Wilcox, male   DOB: April 17, 1951, 63 y.o.   MRN: [id number]      Reason for office visit Bradycardia  Jordan Wilcox was followed by Dr. Susa Griffinsichard Weintraub and was recently seen by our PA, Jordan Wilcox After emergency room visits for dehydration. Diagnosis appear saturated. He had been fasting for ROM and on and after a day working out in the heat in his yard he developed fatigue and malaise. In the emergency room he was markedly bradycardic as well as hypotensive. He improved after administration of intravenous fluids. After that, his bradycardia has been asymptomatic. He feels great now.  He does not take any medications that could cause bradycardia. He has well compensated hypercholesterolemia and systemic hypertension. He has a healthy lifestyle with good attention to diet and exercise.   Allergies  Allergen Reactions  . Pork-Derived Products Other (See Comments)    Pt does not eat pork for religious reasons    Current Outpatient Prescriptions  Medication Sig Dispense Refill  . ALOE VERA JUICE LIQD Take 60 mLs by mouth 2 (two) times a week.      Marland Kitchen. aspirin EC 81 MG tablet Take 81 mg by mouth 2 (two) times a week. On Sundays and Mondays      . atorvastatin (LIPITOR) 10 MG tablet Take 10 mg by mouth daily.      Marland Kitchen. BEE POLLEN PO Take 2 tablets by mouth 2 (two) times daily.      . Cholecalciferol (VITAMIN D PO) Take 1 tablet by mouth daily.      Marland Kitchen. losartan (COZAAR) 50 MG tablet Take 50 mg by mouth daily.      . Misc Natural Products (BEE PROPOLIS PO) Take 1 tablet by mouth once a week.      Marland Kitchen. ROYAL JELLY PO Take 1 tablet by mouth daily as needed (energy).      . triamterene-hydrochlorothiazide (MAXZIDE-25) 37.5-25 MG per tablet Take 1 tablet by mouth daily.       No current facility-administered medications for this visit.    Past Medical History  Diagnosis Date  . GERD (gastroesophageal reflux disease)   . PNA (pneumonia)   . Hypertension   . Asthma   . Allergic  rhinitis   . DJD (degenerative joint disease)   . Visual impairment   . Tinea pedis   . DDD (degenerative disc disease), cervical   . Restrictive lung disease     No past surgical history on file.  Family History  Problem Relation Age of Onset  . Hypertension Mother     History   Social History  . Marital Status: Married    Spouse Name: N/A    Number of Children: N/A  . Years of Education: N/A   Occupational History  . Not on file.   Social History Main Topics  . Smoking status: Never Smoker   . Smokeless tobacco: Not on file  . Alcohol Use: No  . Drug Use: No  . Sexual Activity: Not on file   Other Topics Concern  . Not on file   Social History Narrative  . No narrative on file    Review of systems: The patient specifically denies any chest pain at rest or with exertion, dyspnea at rest or with exertion, orthopnea, paroxysmal nocturnal dyspnea, syncope, palpitations, focal neurological deficits, intermittent claudication, lower extremity edema, unexplained weight gain, cough, hemoptysis or wheezing.  The patient also denies abdominal pain, nausea, vomiting, dysphagia, diarrhea, constipation, polyuria, polydipsia, dysuria, hematuria, frequency,  urgency, abnormal bleeding or bruising, fever, chills, unexpected weight changes, mood swings, change in skin or hair texture, change in voice quality, auditory or visual problems, allergic reactions or rashes, new musculoskeletal complaints other than usual "aches and pains".   PHYSICAL EXAM BP 121/70  Pulse 55  Resp 16  Ht 5\' 8"  (1.727 m)  Wt 78.382 kg (172 lb 12.8 oz)  BMI 26.28 kg/m2  General: Alert, oriented x3, no distress Head: no evidence of trauma, PERRL, EOMI, no exophtalmos or lid lag, no myxedema, no xanthelasma; normal ears, nose and oropharynx Neck: normal jugular venous pulsations and no hepatojugular reflux; brisk carotid pulses without delay and no carotid bruits Chest: clear to auscultation, no signs  of consolidation by percussion or palpation, normal fremitus, symmetrical and full respiratory excursions Cardiovascular: normal position and quality of the apical impulse, regular rhythm, normal first and second heart sounds, no murmurs, rubs or gallops Abdomen: no tenderness or distention, no masses by palpation, no abnormal pulsatility or arterial bruits, normal bowel sounds, no hepatosplenomegaly Extremities: no clubbing, cyanosis or edema; 2+ radial, ulnar and brachial pulses bilaterally; 2+ right femoral, posterior tibial and dorsalis pedis pulses; 2+ left femoral, posterior tibial and dorsalis pedis pulses; no subclavian or femoral bruits Neurological: grossly nonfocal   EKG: Sinus bradycardia 55 beats per minute, otherwise normal  Lipid Panel  No results found for this basename: chol, trig, hdl, cholhdl, vldl, ldlcalc, ldldirect    BMET    Component Value Date/Time   NA 139 01/07/2014 1930   K 3.9 01/07/2014 1930   CL 98 01/07/2014 1930   CO2 29 01/07/2014 1930   GLUCOSE 96 01/07/2014 1930   BUN 13 01/07/2014 1930   CREATININE 1.13 01/07/2014 1930   CALCIUM 9.5 01/07/2014 1930   GFRNONAA 67* 01/07/2014 1930   GFRAA 78* 01/07/2014 1930     ASSESSMENT AND PLAN Ramie's recent emergency room visit [place] in the related to dehydration secondary to fasting, continued use of diuretics for hypertension and working outside in hot weather. He seems to have persistent sinus bradycardia, which will make him more vulnerable to the effects of hypotension or hypovolemia.  He needs to avoid negative chronotropic agents. I do not think at this point a pacemaker is indicated yet. Have asked him to call us if he develops dizziness, syncope or worsening fatigue/dyspnea, none of which are currently an issue.  Orders Placed This Encounter  Procedures  . EKG 12-Lead   No orders of the defined types were placed in this encounter.    Junious Silk, MD, [facility] [phone] office [phone] pager

## 2014-11-27 ENCOUNTER — Telehealth: Payer: Self-pay | Admitting: Cardiovascular Disease

## 2014-11-27 NOTE — Telephone Encounter (Signed)
Patient on no AV nodal blocking agents and asymptomatic; would arrange PAOV to further assess and check ECG to document rhythm/HR. Olga MillersBrian Crenshaw

## 2014-11-27 NOTE — Telephone Encounter (Signed)
Pt in [place], had scheduled eye surgery today.  At pre-procedure vitals, noted HR of 36 - they cancelled the surgery.  Pt denies any symptoms, fatigue, weakness, dyspnea, etc. - not sure of onset - states no recent med changes.   Unless otherwise instructed they will be in [place] at least through tomorrow morning [place] [facility] [place]. Told to seek hospital care if symptoms - Will route to [employer] for recommendation, update pt w/ advice.

## 2014-11-27 NOTE — Telephone Encounter (Signed)
Called pt back w/ instruction - Set up for EKG visit w/ triage nurse for tomorrow, will also request add-in for flex visit tomorrow for brady eval, [facility] will cancel nurse EKG visit.

## 2014-11-27 NOTE — Telephone Encounter (Signed)
Mrs. Jordan Wilcox is calling  Because [place] heart rate is low . Eye surgery is schedule for today and she was told that his heat rate was low . It is btwn 36-38.Marland Kitchen. Please call

## 2014-11-27 NOTE — Telephone Encounter (Signed)
Also because his heart rate is low wants to know does she needs to take him the ED or Urgent Care . Please call

## 2014-11-28 ENCOUNTER — Ambulatory Visit (INDEPENDENT_AMBULATORY_CARE_PROVIDER_SITE_OTHER): Payer: Managed Care, Other (non HMO)

## 2014-11-28 ENCOUNTER — Encounter: Payer: Self-pay | Admitting: Internal Medicine

## 2014-11-28 ENCOUNTER — Telehealth: Payer: Self-pay | Admitting: *Deleted

## 2014-11-28 ENCOUNTER — Ambulatory Visit (INDEPENDENT_AMBULATORY_CARE_PROVIDER_SITE_OTHER): Payer: Managed Care, Other (non HMO) | Admitting: Internal Medicine

## 2014-11-28 VITALS — BP 122/74 | HR 50 | Ht 68.5 in | Wt 177.6 lb

## 2014-11-28 DIAGNOSIS — R001 Bradycardia, unspecified: Secondary | ICD-10-CM

## 2014-11-28 DIAGNOSIS — I499 Cardiac arrhythmia, unspecified: Secondary | ICD-10-CM | POA: Diagnosis not present

## 2014-11-28 DIAGNOSIS — I498 Other specified cardiac arrhythmias: Secondary | ICD-10-CM | POA: Insufficient documentation

## 2014-11-28 NOTE — Progress Notes (Signed)
Patient ID: Jordan Wilcox, male   DOB: 06-19-1951, 64 y.o.   MRN: [id number]      Reason for office visit Bradycardia  Jordan Wilcox was followed by Dr. Susa Griffins and was recently seen by our PA, Robbie Lis After emergency room visits for dehydration. Diagnosis appear saturated. He had been fasting for ROM and on and after a day working out in the heat in his yard he developed fatigue and malaise. In the emergency room he was markedly bradycardic as well as hypotensive. He improved after administration of intravenous fluids. After that, his bradycardia has been asymptomatic. He feels great now.  He does not take any medications that could cause bradycardia. He has well compensated hypercholesterolemia and systemic hypertension. He has a healthy lifestyle with good attention to diet and exercise.  I saw Jordan Wilcox today as a work in visit for Dr. Salena Saner. He recently was seen for eye surgery was found to be bradycardic with heart rates in the 30s. He was referred for perioperative workup and risk assessment. He does have a history of bradycardia in the past. An EKG was performed which demonstrated a ventricular bigeminy with nonconducted PVCs. Heart rate was in the upper 30s. He was reportedly asymptomatic. He denies chest pain or shortness of breath. He does feel some fatigue but is not particularly active.   Allergies  Allergen Reactions  . Pork-Derived Products Other (See Comments)    Pt does not eat pork for religious reasons    Current Outpatient Prescriptions  Medication Sig Dispense Refill  . ALOE VERA JUICE LIQD Take 60 mLs by mouth 2 (two) times a week.    Marland Kitchen aspirin EC 81 MG tablet Take 81 mg by mouth 2 (two) times a week. On Sundays and Mondays    . atorvastatin (LIPITOR) 10 MG tablet Take 10 mg by mouth daily.    Marland Kitchen BEE POLLEN PO Take 2 tablets by mouth 2 (two) times daily.    . Cholecalciferol (VITAMIN D PO) Take 1 tablet by mouth daily.    Marland Kitchen losartan (COZAAR) 50 MG tablet Take  50 mg by mouth daily.    Marland Kitchen ROYAL JELLY PO Take 1 tablet by mouth daily as needed (energy).    . triamterene-hydrochlorothiazide (MAXZIDE-25) 37.5-25 MG per tablet Take 0.5 tablets by mouth daily.     No current facility-administered medications for this visit.    Past Medical History  Diagnosis Date  . GERD (gastroesophageal reflux disease)   . PNA (pneumonia)   . Hypertension   . Asthma   . Allergic rhinitis   . DJD (degenerative joint disease)   . Visual impairment   . Tinea pedis   . DDD (degenerative disc disease), cervical   . Restrictive lung disease     No past surgical history on file.  Family History  Problem Relation Age of Onset  . Hypertension Mother     History   Social History  . Marital Status: Married    Spouse Name: N/A  . Number of Children: N/A  . Years of Education: N/A   Occupational History  . Not on file.   Social History Main Topics  . Smoking status: Never Smoker   . Smokeless tobacco: Not on file  . Alcohol Use: No  . Drug Use: No  . Sexual Activity: Not on file   Other Topics Concern  . Not on file   Social History Narrative    Review of systems: The patient specifically denies any chest pain  at rest or with exertion, dyspnea at rest or with exertion, orthopnea, paroxysmal nocturnal dyspnea, syncope, palpitations, focal neurological deficits, intermittent claudication, lower extremity edema, unexplained weight gain, cough, hemoptysis or wheezing.  The patient also denies abdominal pain, nausea, vomiting, dysphagia, diarrhea, constipation, polyuria, polydipsia, dysuria, hematuria, frequency, urgency, abnormal bleeding or bruising, fever, chills, unexpected weight changes, mood swings, change in skin or hair texture, change in voice quality, auditory or visual problems, allergic reactions or rashes, new musculoskeletal complaints other than usual "aches and pains".   PHYSICAL EXAM BP 122/74 mmHg  Pulse 50  Ht 5' 8.5" (1.74 m)  Wt  177 lb 9.6 oz (80.559 kg)  BMI 26.61 kg/m2  General: Alert, oriented x3, no distress Head: no evidence of trauma, PERRL, EOMI, no exophtalmos or lid lag, no myxedema, no xanthelasma; normal ears, nose and oropharynx Neck: normal jugular venous pulsations and no hepatojugular reflux; brisk carotid pulses without delay and no carotid bruits Chest: clear to auscultation, no signs of consolidation by percussion or palpation, normal fremitus, symmetrical and full respiratory excursions Cardiovascular: normal position and quality of the apical impulse, regular rhythm, normal first and second heart sounds, no murmurs, rubs or gallops Abdomen: no tenderness or distention, no masses by palpation, no abnormal pulsatility or arterial bruits, normal bowel sounds, no hepatosplenomegaly Extremities: no clubbing, cyanosis or edema; 2+ radial, ulnar and brachial pulses bilaterally; 2+ right femoral, posterior tibial and dorsalis pedis pulses; 2+ left femoral, posterior tibial and dorsalis pedis pulses; no subclavian or femoral bruits Neurological: grossly nonfocal   EKG: Sinus bradycardia 50  Lipid Panel  No results found for: CHOL  BMET    Component Value Date/Time   NA 139 01/07/2014 1930   K 3.9 01/07/2014 1930   CL 98 01/07/2014 1930   CO2 29 01/07/2014 1930   GLUCOSE 96 01/07/2014 1930   BUN 13 01/07/2014 1930   CREATININE 1.13 01/07/2014 1930   CALCIUM 9.5 01/07/2014 1930   GFRNONAA 67* 01/07/2014 1930   GFRAA 78* 01/07/2014 1930     ASSESSMENT: 1. Asymptomatic bradycardia 2. Bigeminal PVCs  PLAN: 1.  Jordan Wilcox does asymptomatic bradycardia. He was having ventricular bigeminy with nonconducted PVCs and therefore the heart rate is very low. He seems to be totally unaware of this. We will go ahead and place a two-week monitor to see if we can't determine the extent of his ectopy and whether or not he is having any significant pauses or meets any criteria for pacemaker. He was previously on  beta blockers but those were removed. I think he still low risk for eye surgery. Follow-up with Dr. Salena Saner in the office in a few weeks.   Orders Placed This Encounter  Procedures  . Cardiac event monitor  . EKG 12-Lead   Meds ordered this encounter  Medications  . triamterene-hydrochlorothiazide (MAXZIDE-25) 37.5-25 MG per tablet    Sig: Take 0.5 tablets by mouth daily.   Chrystie NoseKenneth C. Remer Couse, MD, [facility] Attending Cardiologist [facility]  Chrystie NoseKenneth C Javien Tesch

## 2014-11-28 NOTE — Telephone Encounter (Signed)
Faxed cleared for eye surgical procedure to [facility] Cornell [facility]Arghavan Al[facility] He can hold aspirin 7 days prior to procedure.

## 2014-11-28 NOTE — Patient Instructions (Signed)
Your Doctor has ordered you to wear a heart monitor. You will wear this for 14 days.   Your physician recommends that you schedule a follow-up appointment after your monitor with Dr. Royann Shivers   TIPS -  REMINDERS 1. The sensor is the lanyard that is worn around your neck every day - this is powered by a battery that needs to be changed every day 2. The monitor is the device that allows you to record symptoms - this will need to be charged daily 3. The sensor & monitor need to be within 100 feet of each other at all times 4. The sensor connects to the electrodes (stickers) - these should be changed every 24-48 hours (you do not have to remove them when you bathe, just make sure they are dry when you connect it back to the sensor 5. If you need more supplies (electrodes, batteries), please call the 1-800 # on the back of the pamphlet and [employer] will mail you more supplies 6. If your skin becomes sensitive, please try the sample pack of sensitive skin electrodes (the white packet in your silver box) and call [employer] to have them mail you more of these type of electrodes 7. When you are finish wearing the monitor, please place all supplies back in the silver box, place the silver box in the pre-packaged UPS bag and drop off at [employer] or call them so they can come pick it up   Cardiac Event Monitoring A cardiac event monitor is a small recording device used to help detect abnormal heart rhythms (arrhythmias). The monitor is used to record heart rhythm when noticeable symptoms such as the following occur:  Fast heartbeats (palpitations), such as heart racing or fluttering.  Dizziness.  Fainting or light-headedness.  Unexplained weakness. The monitor is wired to two electrodes placed on your chest. Electrodes are flat, sticky disks that attach to your skin. The monitor can be worn for up to 30 days. You will wear the monitor at all times, except when bathing.  HOW TO USE YOUR CARDIAC EVENT  MONITOR A technician will prepare your chest for the electrode placement. The technician will show you how to place the electrodes, how to work the monitor, and how to replace the batteries. Take time to practice using the monitor before you leave the office. Make sure you understand how to send the information from the monitor to your health care provider. This requires a telephone with a landline, not a cell phone. You need to:  Wear your monitor at all times, except when you are in water:  Do not get the monitor wet.  Take the monitor off when bathing. Do not swim or use a hot tub with it on.  Keep your skin clean. Do not put body lotion or moisturizer on your chest.  Change the electrodes daily or any time they stop sticking to your skin. You might need to use tape to keep them on.  It is possible that your skin under the electrodes could become irritated. To keep this from happening, try to put the electrodes in slightly different places on your chest. However, they must remain in the area under your left breast and in the upper right section of your chest.  Make sure the monitor is safely clipped to your clothing or in a location close to your body that your health care provider recommends.  Press the button to record when you feel symptoms of heart trouble, such as dizziness, weakness, light-headedness,  palpitations, thumping, shortness of breath, unexplained weakness, or a fluttering or racing heart. The monitor is always on and records what happened slightly before you pressed the button, so do not worry about being too late to get good information.  Keep a diary of your activities, such as walking, doing chores, and taking medicine. It is especially important to note what you were doing when you pushed the button to record your symptoms. This will help your health care provider determine what might be contributing to your symptoms. The information stored in your monitor will be reviewed  by your health care provider alongside your diary entries.  Send the recorded information as recommended by your health care provider. It is important to understand that it will take some time for your health care provider to process the results.  Change the batteries as recommended by your health care provider. SEEK IMMEDIATE MEDICAL CARE IF:   You have chest pain.  You have extreme difficulty breathing or shortness of breath.  You develop a very fast heartbeat that persists.  You develop dizziness that does not go away.  You faint or constantly feel you are about to faint. Document Released: 03/24/2008 Document Revised: 10/30/2013 Document Reviewed: 12/12/2012 [facility] Patient Information 2015 [employer] [place]. This information is not intended to replace advice given to you by your health care provider. Make sure you discuss any questions you have with your health care provider.

## 2014-11-28 NOTE — Telephone Encounter (Signed)
Dr. Rennis GoldenHilty had openings, asked if OK to add to schedule for today, he OK'd. Pt was set up for EKG visit this morning w/ triage nurse, this was cancelled in lieu of provider appt. Left message for pt on cell phone this AM instructing on time to come.  Called pt at home, spoke to wife who confirmed receipt of message - pt acknowledged visit provider, time, location.

## 2014-12-04 ENCOUNTER — Telehealth: Payer: Self-pay | Admitting: Internal Medicine

## 2014-12-04 NOTE — Telephone Encounter (Signed)
Pt wants to wait until monitor results are read and has f/u visit w/ Dr. Rennis GoldenHilty on 6/30 to schedule eye surgery. Advised this is fine. He has another week left wearing monitor.

## 2014-12-04 NOTE — Telephone Encounter (Signed)
Pt was scheduled for eye surgery last week,but it was postponed because his heart rate was too low. He came to see Dr Rennis GoldenHilty and is now wearing a monitor for 14 days. The eye place called today and said that he was cleared for surgery by this office, Pt is still wearing the monitor,so is it alright for him to have surgery?

## 2014-12-04 NOTE — Telephone Encounter (Signed)
Wife called back to give phone # for eye surgery center. [phone]  Wife requested eye center be contacted/coordinated with on his issues once monitor report read. She is aware report will not be available for at least a week after returned to [employer].

## 2014-12-06 ENCOUNTER — Telehealth: Payer: Self-pay | Admitting: Internal Medicine

## 2014-12-06 NOTE — Telephone Encounter (Signed)
6.8.16 FMLA/Disability forms were brought in for patient by wife.  She paid processing fee for 2 forms and took ROI with her for husband to complete, sign and return.  6.9.16 Brought back completed and signed ROI.  I sent Release, FMLA and Disability paperwork and check(for 2 forms) to [employer] for processing.lp

## 2014-12-11 NOTE — Telephone Encounter (Signed)
6.14.16 received completed FMLA form and [employer] Disability Form back from [employer].  Gave to Danny Lawless, RN for Dr Rennis Golden to review, complete and sign.  lp

## 2014-12-12 NOTE — Telephone Encounter (Signed)
FMLA paperwork was left on Dr. Blanchie Dessert cart 6/14 as Lynnda Shields, RN, was not in the office.

## 2014-12-13 NOTE — Telephone Encounter (Signed)
Received signed, completed FMLA and Attending Provider Statement [facility]) from Dr Rennis Golden.  Jordan Wilcox Attending Physician Statement along with most recent office notes.  Faxed FMLA.  Notified patient forms were faxed and their copy mailed.  lp

## 2014-12-17 ENCOUNTER — Telehealth: Payer: Self-pay | Admitting: Internal Medicine

## 2014-12-17 NOTE — Telephone Encounter (Signed)
Per patient's request, 6/30 appointment was cancelled as he "already received the results"  Pulled FMLA paperwork. There was NO limitation to work, patient is NOT unable to perform his/her job duties due to condition, patient will NOT be incapacitated for single continuous period of time, flare ups with NOT prevent employee from performing his/her job duties, is NOT medically necessary for employee to be absent from work during flare ups  This was communicated to patient and his wife who is now with him. She was confused why appointment was cancelled and I explained it was cancelled at patient request. Both patient and wife were confused about the work excuse despite multiple attempts at explaining that Dr. Rennis Golden never informed patient to stay out of work. Patient/wife state they assumed he would be out of work because he was wearing a heart monitor. Appears there was a communication gap. RN informed patient/wife we had no knowledge until NOW that he was out of work since June 1st, as the MD did not make this recommendation or provide a work note at initial visit.   Patient/wife inquired about eye surgery clearance - informed them this was faxed this AM - they requested copy of clearance form - left at front desk for patient to pick up today.  Patient/wife inquired if FMLA paperwork had been sent to [facility] - informed was faxed on 6/16

## 2014-12-17 NOTE — Telephone Encounter (Signed)
Spoke with patient who is requesting a return to work note.   When patient saw Dr. Rennis Golden on 11/28/14 a 2 week event monitor was ordered and apparently patient made the decision to stay out of work until his monitor results were in. He received the results this AM. Explained to patient that the issue is that Dr. Rennis Golden did NOT write a letter or recommend that patient be out of work so there was not communication of this between the patient and MD/nurse.   Patient reports he was scheduled to have eye surgery on 5/31 and told his manager at [employer] he would be on short-term disability for this.. And instead was unable to have surgery d/t low HR which prompted office visit/monitor.   Explained to patient that Dr. Rennis Golden is out of town and will not be back until Monday 6/27  Dr. Rennis Golden did complete FMLA paperwork for patient  Will route to Dr. Royann Shivers to assist if possible

## 2014-12-17 NOTE — Telephone Encounter (Signed)
Faxed clearance for posterior vitrectomy under IV sedation with Dr. Dolores Hoose @ [facility] (fax: [phone])

## 2014-12-17 NOTE — Telephone Encounter (Signed)
Calling to get a note to go back to work .Marland Kitchen Please call  At [phone].Marland Kitchen

## 2014-12-19 ENCOUNTER — Encounter: Payer: Self-pay | Admitting: *Deleted

## 2014-12-25 ENCOUNTER — Encounter: Payer: Self-pay | Admitting: Internal Medicine

## 2014-12-26 ENCOUNTER — Ambulatory Visit (INDEPENDENT_AMBULATORY_CARE_PROVIDER_SITE_OTHER): Payer: Managed Care, Other (non HMO) | Admitting: Internal Medicine

## 2014-12-26 ENCOUNTER — Encounter: Payer: Self-pay | Admitting: Internal Medicine

## 2014-12-26 VITALS — BP 136/64 | HR 52 | Ht 67.5 in | Wt 175.5 lb

## 2014-12-26 DIAGNOSIS — I499 Cardiac arrhythmia, unspecified: Secondary | ICD-10-CM

## 2014-12-26 DIAGNOSIS — R001 Bradycardia, unspecified: Secondary | ICD-10-CM

## 2014-12-26 DIAGNOSIS — I498 Other specified cardiac arrhythmias: Secondary | ICD-10-CM

## 2014-12-26 NOTE — Progress Notes (Signed)
Patient ID: Jordan Wilcox, male   DOB: 1951-01-24, 64 y.o.   MRN: [id number]      Reason for office visit Follow-up monitor  Jordan Wilcox was followed by Dr. Susa Griffinsichard Weintraub and was recently seen by our PA, Jordan Wilcox After emergency room visits for dehydration. Diagnosis appear saturated. He had been fasting for ROM and on and after a day working out in the heat in his yard he developed fatigue and malaise. In the emergency room he was markedly bradycardic as well as hypotensive. He improved after administration of intravenous fluids. After that, his bradycardia has been asymptomatic. He feels great now.  He does not take any medications that could cause bradycardia. He has well compensated hypercholesterolemia and systemic hypertension. He has a healthy lifestyle with good attention to diet and exercise.  I saw Jordan Wilcox today as a work in visit for Dr. Salena Saner. He recently was seen for eye surgery was found to be bradycardic with heart rates in the 30s. He was referred for perioperative workup and risk assessment. He does have a history of bradycardia in the past. An EKG was performed which demonstrated a ventricular bigeminy with nonconducted PVCs. Heart rate was in the upper 30s. He was reportedly asymptomatic. He denies chest pain or shortness of breath. He does feel some fatigue but is not particularly active.  Jordan Wilcox returns today for follow-up. He underwent monitoring which demonstrated heart rates as low as 40 however he was asymptomatic. He did report palpitations however there was no episodes of PVCs that were recorded. No ventricular bigeminy was noted either. He denies any chest pain has not been presyncopal or symptomatic.   Allergies  Allergen Reactions  . Pork-Derived Products Other (See Comments)    Pt does not eat pork for religious reasons    Current Outpatient Prescriptions  Medication Sig Dispense Refill  . ALOE VERA JUICE LIQD Take 60 mLs by mouth 2 (two) times a  week.    Marland Kitchen. aspirin EC 81 MG tablet Take 81 mg by mouth 2 (two) times a week. On Sundays and Mondays    . atorvastatin (LIPITOR) 10 MG tablet Take 10 mg by mouth daily.    Marland Kitchen. BEE POLLEN PO Take 2 tablets by mouth 2 (two) times daily.    . Cholecalciferol (VITAMIN D PO) Take 1 tablet by mouth daily.    Marland Kitchen. losartan (COZAAR) 50 MG tablet Take 50 mg by mouth daily.    Marland Kitchen. ROYAL JELLY PO Take 1 tablet by mouth daily as needed (energy).    . triamterene-hydrochlorothiazide (MAXZIDE-25) 37.5-25 MG per tablet Take 0.5 tablets by mouth daily.     No current facility-administered medications for this visit.    Past Medical History  Diagnosis Date  . GERD (gastroesophageal reflux disease)   . PNA (pneumonia)   . Hypertension   . Asthma   . Allergic rhinitis   . DJD (degenerative joint disease)   . Visual impairment   . Tinea pedis   . DDD (degenerative disc disease), cervical   . Restrictive lung disease     Past Surgical History  Procedure Laterality Date  . Transthoracic echocardiogram  03/17/2012    MILD CONCENTRIC LVH. EF =>55%. LA MILDLY DILATED.  Marland Kitchen. Nm myocar multiple w/spect  03/17/2012    NORMAL STUDY. EF 65%.  . Renal duplex  03/17/2012    ESSENTIALLY NORMAL.    Family History  Problem Relation Age of Onset  . Hypertension Mother     History  Social History  . Marital Status: Married    Spouse Name: N/A  . Number of Children: N/A  . Years of Education: N/A   Occupational History  . Not on file.   Social History Main Topics  . Smoking status: Never Smoker   . Smokeless tobacco: Not on file  . Alcohol Use: No  . Drug Use: No  . Sexual Activity: Not on file   Other Topics Concern  . Not on file   Social History Narrative    Review of systems: The patient specifically denies any chest pain at rest or with exertion, dyspnea at rest or with exertion, orthopnea, paroxysmal nocturnal dyspnea, syncope, palpitations, focal neurological deficits, intermittent  claudication, lower extremity edema, unexplained weight gain, cough, hemoptysis or wheezing.  The patient also denies abdominal pain, nausea, vomiting, dysphagia, diarrhea, constipation, polyuria, polydipsia, dysuria, hematuria, frequency, urgency, abnormal bleeding or bruising, fever, chills, unexpected weight changes, mood swings, change in skin or hair texture, change in voice quality, auditory or visual problems, allergic reactions or rashes, new musculoskeletal complaints other than usual "aches and pains".   PHYSICAL EXAM BP 136/64 mmHg  Pulse 52  Ht 5' 7.5" (1.715 m)  Wt 175 lb 8 oz (79.606 kg)  BMI 27.07 kg/m2  Deferred   EKG: Deferred  Lipid Panel  No results found for: CHOL  BMET    Component Value Date/Time   NA 139 01/07/2014 1930   K 3.9 01/07/2014 1930   CL 98 01/07/2014 1930   CO2 29 01/07/2014 1930   GLUCOSE 96 01/07/2014 1930   BUN 13 01/07/2014 1930   CREATININE 1.13 01/07/2014 1930   CALCIUM 9.5 01/07/2014 1930   GFRNONAA 67* 01/07/2014 1930   GFRAA 78* 01/07/2014 1930     ASSESSMENT: 1. Asymptomatic bradycardia 2. Bigeminal PVCs  PLAN: 1.  Jordan Wilcox had no evidence of recurrent bigeminy or PVCs on his monitor but may be having them and are intermittently. He reports palpitations but they're not lifestyle limiting. I did not see anything that indicated the need for pacemaker him as an a to recommend beta-blockade based on his baseline bradycardia. A feeling he is at low risk for eye surgery. We can see him back in 6 months, or sooner as needed.  No orders of the defined types were placed in this encounter.   No orders of the defined types were placed in this encounter.   Chrystie Nose, MD, [facility] Attending Cardiologist [facility]  Chrystie Nose

## 2014-12-26 NOTE — Patient Instructions (Signed)
Your physician wants you to follow-up in: 6 months with Dr. Hilty. You will receive a reminder letter in the mail two months in advance. If you don't receive a letter, please call our office to schedule the follow-up appointment.    

## 2014-12-27 ENCOUNTER — Ambulatory Visit: Payer: Managed Care, Other (non HMO) | Admitting: Internal Medicine
